# Patient Record
Sex: Female | Born: 1960 | Race: White | Hispanic: No | Smoking: Former smoker
Health system: Southern US, Community
[De-identification: ages and names within clinical notes are randomized; demographics above are authoritative.]

## PROBLEM LIST (undated history)

## (undated) DIAGNOSIS — F1020 Alcohol dependence, uncomplicated: Secondary | ICD-10-CM

## (undated) DIAGNOSIS — E079 Disorder of thyroid, unspecified: Secondary | ICD-10-CM

## (undated) HISTORY — PX: ABDOMINAL HYSTERECTOMY: SHX81

## (undated) HISTORY — PX: TUBAL LIGATION: SHX77

---

## 2018-10-21 ENCOUNTER — Encounter (HOSPITAL_COMMUNITY): Payer: Self-pay

## 2018-10-21 ENCOUNTER — Emergency Department (HOSPITAL_COMMUNITY): Payer: BC Managed Care – PPO

## 2018-10-21 ENCOUNTER — Other Ambulatory Visit: Payer: Self-pay

## 2018-10-21 ENCOUNTER — Emergency Department (HOSPITAL_COMMUNITY)
Admission: EM | Admit: 2018-10-21 | Discharge: 2018-10-21 | Disposition: A | Discharge status: Home / Self Care | Payer: BC Managed Care – PPO | Attending: Emergency Medicine | Admitting: Emergency Medicine

## 2018-10-21 DIAGNOSIS — E039 Hypothyroidism, unspecified: Secondary | ICD-10-CM | POA: Insufficient documentation

## 2018-10-21 DIAGNOSIS — Z87891 Personal history of nicotine dependence: Secondary | ICD-10-CM | POA: Insufficient documentation

## 2018-10-21 DIAGNOSIS — M7918 Myalgia, other site: Secondary | ICD-10-CM

## 2018-10-21 DIAGNOSIS — R0789 Other chest pain: Secondary | ICD-10-CM | POA: Diagnosis present

## 2018-10-21 DIAGNOSIS — R0782 Intercostal pain: Secondary | ICD-10-CM | POA: Diagnosis not present

## 2018-10-21 HISTORY — DX: Disorder of thyroid, unspecified: E07.9

## 2018-10-21 HISTORY — DX: Alcohol dependence, uncomplicated: F10.20

## 2018-10-21 LAB — CBC
HCT: 29.7 % — ABNORMAL LOW (ref 36.0–46.0)
Hemoglobin: 9.5 g/dL — ABNORMAL LOW (ref 12.0–15.0)
MCH: 33.9 pg (ref 26.0–34.0)
MCHC: 32 g/dL (ref 30.0–36.0)
MCV: 106.1 fL — ABNORMAL HIGH (ref 80.0–100.0)
Platelets: 363 10*3/uL (ref 150–400)
RBC: 2.8 MIL/uL — ABNORMAL LOW (ref 3.87–5.11)
RDW: 16.6 % — ABNORMAL HIGH (ref 11.5–15.5)
WBC: 8.9 10*3/uL (ref 4.0–10.5)
nRBC: 0 % (ref 0.0–0.2)

## 2018-10-21 LAB — BASIC METABOLIC PANEL
Anion gap: 8 (ref 5–15)
BUN: 10 mg/dL (ref 6–20)
CO2: 23 mmol/L (ref 22–32)
Calcium: 9.2 mg/dL (ref 8.9–10.3)
Chloride: 107 mmol/L (ref 98–111)
Creatinine, Ser: 0.69 mg/dL (ref 0.44–1.00)
GFR calc Af Amer: >60 mL/min (ref >60)
GFR calc non Af Amer: >60 mL/min (ref >60)
Glucose, Bld: 100 mg/dL — ABNORMAL HIGH (ref 70–99)
Potassium: 3.9 mmol/L (ref 3.5–5.1)
Sodium: 138 mmol/L (ref 135–145)

## 2018-10-21 LAB — D-DIMER, QUANTITATIVE: D-Dimer, Quant: 0.78 ug/mL-FEU — ABNORMAL HIGH (ref 0.00–0.50)

## 2018-10-21 LAB — TROPONIN I (HIGH SENSITIVITY)
Troponin I (High Sensitivity): 4 ng/L (ref <18)
Troponin I (High Sensitivity): 3 ng/L (ref <18)

## 2018-10-21 MED ORDER — SODIUM CHLORIDE 0.9% FLUSH
3.0000 mL | Freq: Once | INTRAVENOUS | Status: AC
  Administered 2018-10-21: 16:00:00 3 mL via INTRAVENOUS

## 2018-10-21 MED ORDER — LIDOCAINE 5 % EX PTCH
1.0000 | MEDICATED_PATCH | Freq: Two times a day (BID) | CUTANEOUS | Status: DC | PRN
  Administered 2018-10-21: 1 via TRANSDERMAL
  Filled 2018-10-21: qty 1

## 2018-10-21 MED ORDER — LIDOCAINE 5 % EX PTCH: 1.0000 | MEDICATED_PATCH | Freq: Every day | CUTANEOUS | 0 refills | Status: AC | PRN

## 2018-10-21 MED ORDER — IOHEXOL 350 MG/ML SOLN
75.0000 mL | Freq: Once | INTRAVENOUS | Status: AC | PRN
  Administered 2018-10-21: 18:00:00 75 mL via INTRAVENOUS

## 2018-10-21 NOTE — ED Provider Notes (Signed)
[facility] EMERGENCY DEPARTMENT Provider Note   CSN: [id number] Arrival date & time: 10/21/18  1552    History   Chief Complaint Chief Complaint  Patient presents with   Chest Pain    HPI Imagene Boss is a 58 y.o. female.     HPI  Pt is a 58 year old female with a PMHx of alcoholism, anxiety, hypothyroidism who is currently at the beginning of week 2 of a 28-day inpatient alcohol treatment facility who presents to the ED today with a two day history of left chest wall pain. The pain is located along the left anterior lower chest wall at region of ribs 6-7 as well as just above her left clavicle. The pain is described as sharp and worse with deep breathing. It is improved with ibuprofen. She is concerned about a "blood clot." Reports acute onset of sharp pain to left chest wall, two nights ago, located under the left breast.    Past Medical History:  Diagnosis Date   Alcoholism ([place])    Thyroid disease     There are no active problems to display for this patient.   Past Surgical History:  Procedure Laterality Date   ABDOMINAL HYSTERECTOMY     TUBAL LIGATION       OB History   No obstetric history on file.      Home Medications    Prior to Admission medications   Not on File    Family History No family history on file.  Social History Social History   Tobacco Use   Smoking status: Former Smoker   Smokeless tobacco: Never Used  Substance Use Topics   Alcohol use: Yes    Comment: last use 10/07/18   Drug use: Never     Allergies   Patient has no known allergies.   Review of Systems Review of Systems  Constitutional: Negative for chills, fatigue and fever.  HENT: Negative for sore throat, trouble swallowing and voice change.   Respiratory: Negative for cough, chest tightness, shortness of breath and wheezing.   Cardiovascular: Positive for chest pain (left anterior, see above). Negative for palpitations and leg swelling.    Gastrointestinal: Negative for abdominal pain, nausea and vomiting.  Genitourinary: Negative for difficulty urinating, dysuria, pelvic pain and urgency.  Musculoskeletal: Negative for neck pain and neck stiffness.  Skin: Negative for color change and wound.  Neurological: Negative for weakness, light-headedness and numbness.  Psychiatric/Behavioral: Positive for sleep disturbance. The patient is nervous/anxious.      Physical Exam Updated Vital Signs BP 117/74    Pulse 82    Temp 99 F (37.2 C) (Oral)    Resp 14    Ht 5' 5.5" (1.664 m)    Wt 60.3 kg    SpO2 96%    BMI 21.80 kg/m   Physical Exam Vitals signs and nursing note reviewed.  Constitutional:      General: She is not in acute distress.    Appearance: She is well-developed. She is not ill-appearing.  HENT:     Head: Normocephalic and atraumatic.  Eyes:     Conjunctiva/sclera: Conjunctivae normal.  Neck:     Musculoskeletal: Neck supple.  Cardiovascular:     Rate and Rhythm: Normal rate and regular rhythm.     Heart sounds: No murmur.  Pulmonary:     Effort: Pulmonary effort is normal. No respiratory distress.     Breath sounds: Normal breath sounds.  Abdominal:     Palpations: Abdomen  is soft.     Tenderness: There is no abdominal tenderness.  Skin:    General: Skin is warm and dry.  Neurological:     Mental Status: She is alert.     GCS: GCS eye subscore is 4. GCS verbal subscore is 5. GCS motor subscore is 6.      ED Treatments / Results  Labs (all labs ordered are listed, but only abnormal results are displayed) Labs Reviewed  BASIC METABOLIC PANEL - Abnormal; Notable for the following components:      Result Value   Glucose, Bld 100 (*)    All other components within normal limits  CBC - Abnormal; Notable for the following components:   RBC 2.80 (*)    Hemoglobin 9.5 (*)    HCT 29.7 (*)    MCV 106.1 (*)    RDW 16.6 (*)    All other components within normal limits  D-DIMER, QUANTITATIVE (NOT AT  [facility]) - Abnormal; Notable for the following components:   D-Dimer, Quant 0.78 (*)    All other components within normal limits  TROPONIN I (HIGH SENSITIVITY)  TROPONIN I (HIGH SENSITIVITY)    EKG None  Radiology  Dg Chest 2 View  Result Date: 10/21/2018 CLINICAL DATA:  58 year old presenting with a 3 day history of LEFT-sided chest pain. Former smoker. EXAM: CHEST - 2 VIEW COMPARISON:  None. FINDINGS: Cardiomediastinal silhouette unremarkable. Lungs clear. Bronchovascular markings normal. Pulmonary vascularity normal. No visible pleural effusions. No pneumothorax. Mild elevation of the LEFT hemidiaphragm due to mild gaseous distention of the stomach and the splenic flexure of the colon. Visualized bony thorax intact. IMPRESSION: No acute cardiopulmonary disease. Electronically Signed   By: Hulan Saashomas  Lawrence M.D.   On: 10/21/2018 16:59   Ct Angio Chest Pe W And/or Wo Contrast  Result Date: 10/21/2018 CLINICAL DATA:  Sharp anterior left chest pain. Elevated D-dimer. EXAM: CT ANGIOGRAPHY CHEST WITH CONTRAST TECHNIQUE: Multidetector CT imaging of the chest was performed using the standard protocol during bolus administration of intravenous contrast. Multiplanar CT image reconstructions and MIPs were obtained to evaluate the vascular anatomy. CONTRAST:  75mL OMNIPAQUE IOHEXOL 350 MG/ML SOLN COMPARISON:  Chest x-ray dated 10/21/2018 FINDINGS: Cardiovascular: Satisfactory opacification of the pulmonary arteries to the segmental level. No evidence of pulmonary embolism. Normal heart size. No pericardial effusion. Mediastinum/Nodes: No enlarged mediastinal, hilar, or axillary lymph nodes. Thyroid gland, trachea, and esophagus demonstrate no significant findings. Lungs/Pleura: Lungs are clear. No pleural effusion or pneumothorax. Upper Abdomen: Normal. Musculoskeletal: No chest wall abnormality. No acute or significant osseous findings. Review of the MIP images confirms the above findings. IMPRESSION: Normal  CT angiogram of the chest. Electronically Signed   By: Francene BoyersJames  Maxwell M.D.   On: 10/21/2018 18:34    Procedures Procedures (including critical care time)  Medications Ordered in ED Medications  sodium chloride flush (NS) 0.9 % injection 3 mL (3 mLs Intravenous Given 10/21/18 1622)  iohexol (OMNIPAQUE) 350 MG/ML injection 75 mL (75 mLs Intravenous Contrast Given 10/21/18 1813)     Initial Impression / Assessment and Plan / ED Course  I have reviewed the triage vital signs and the nursing notes.  Pertinent labs & imaging results that were available during my care of the patient were reviewed by me and considered in my medical decision making (see chart for details).  Of note, this patient was evaluated in the Emergency Department for the symptoms described in the history of present illness. She was evaluated in the context of the  global COVID-19 pandemic, which necessitated consideration that the patient might be at risk for infection with the SARS-CoV-2 virus that causes COVID-19. Institutional protocols and algorithms that pertain to the evaluation of patients at risk for COVID-19 are in a state of rapid change based on information released by regulatory bodies including the CDC and federal and state organizations. These policies and algorithms were followed during the patient's care in the ED.  During this patient encounter, the patient was wearing a mask, and throughout this encounter I was wearing at least a surgical mask.  I was not within 6 feet of this patient for more than 15 minutes without eye protection when they were not wearing mask.   Differentials considered: intercostal muscle strain, chest wall pain, ACS, PE, pneumonia  EM Physician interpretation of Labs & Imaging:  Normal CXR  Normal BMP and Troponin   CBC with chronic, stable anemia  D-dimer elevated > CTA PE scan negative   Medical Decision Making:  Venia MinksKaren Shippy is a 58 y.o. female with a PMHx significant for  alcoholism and is currently residing at the [facility] inpatient [facility] facility who presents to the ED today for evaluation of a sharp pain along her lower left chest and to a focal point above her clavicle on the same side. See HPI above for further details regarding the pain itself. After a thorough HPI, PE and chart review,  I doubt pneumonia, ACS, pleural effusion or pulmonary embolism as the primary etiology for her current pain.  It is most likely musculoskeletal chest wall pain. She has some comfort/minor relief when cupping/holding a hand directly over the same affected area, and has some relief with NSAIDs. I encouraged her to continue with OTC NSAIDS as tolerated and as needed. She should follow up with her PCP once she gets back home and or at the earliest next available ED follow up visit.   She was given a Rx for Lidoderm patches.   The plan for this patient was discussed with my attending physician, Dr. Venia MinksKaren Ponder, who voiced agreement and who oversaw evaluation and treatment of this patient.   CLINICAL IMPRESSION: 1. Chest wall pain   2. Intercostal muscle pain      Disposition: Discharge  Key discharge instructions: Strict return precautions provided. Patient was encouraged to return to the ED should they experience worsening or persistence of current symptoms, or should they develop new concerning symptoms. Encouraged them to f/u with their PCP on an outpatient basis. Questions regarding the diagnosis were answered, and side effects regarding therapies were provided in writing or orally. Patient discharged in stable condition.   Moises Terpstra A. Mayford KnifeWilliams, MD Resident Physician, PGY-3 Emergency Medicine [employer]    Saverio DankerWilliams, Avyaan Summer A, MD 10/22/18 [id number]    Gwyneth SproutPlunkett, [place], MD 10/22/18 [phone]

## 2018-10-21 NOTE — ED Notes (Signed)
Calling [facility] to give Report and arrange transport when the pt is discharged.

## 2018-10-21 NOTE — ED Notes (Signed)
Patient verbalizes understanding of discharge instructions. Opportunity for questioning and answers were provided. Armband removed by staff, pt discharged from ED.  

## 2018-10-21 NOTE — ED Notes (Signed)
Pt ambulated to the RR O2 was 100% and HR was 93 when returning back to room. Small amount of chest pain. Pt stated chest pain feels about the same as before. No complaints of  SOB or dizziness.

## 2018-10-21 NOTE — ED Triage Notes (Signed)
Pt arrives EMS from [facility] where she has had left sided sharp chest pain since Saturday. Worsening since Saturday night. Family hx of blood clot.

## 2018-10-22 ENCOUNTER — Encounter (HOSPITAL_COMMUNITY): Payer: Self-pay | Admitting: Emergency Medicine

## 2020-01-02 DEATH — deceased

## 2020-07-21 IMAGING — CT CT ANGIOGRAPHY CHEST
2 of 7 series · 18 of 46 positions shown · IV contrast (APPLIED)
Comparison: Chest x-ray dated 10/21/2018

CLINICAL DATA: Sharp anterior left chest pain. Elevated D-dimer.

EXAM:
CT ANGIOGRAPHY CHEST WITH CONTRAST
TECHNIQUE: Multidetector CT imaging of the chest was performed using the
standard protocol during bolus administration of intravenous
contrast. Multiplanar CT image reconstructions and MIPs were
obtained to evaluate the vascular anatomy.
CONTRAST:  75mL OMNIPAQUE IOHEXOL 350 MG/ML SOLN

[Series 7: thins · axial · 0.62mm/px · z∈[+918,+1137]mm · 15 of 353 slices shown]
[im 20/353  lung]
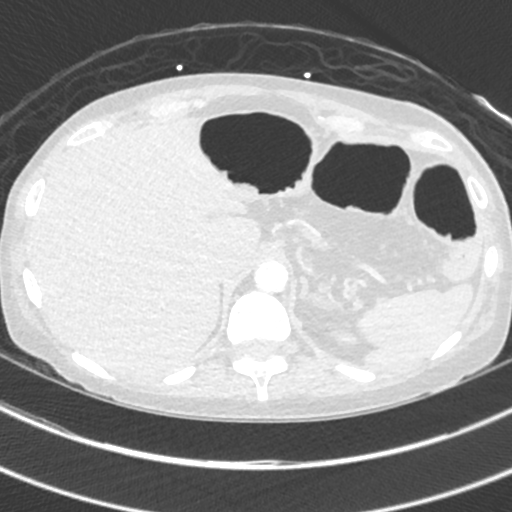
[im 40/353  soft-tissue]
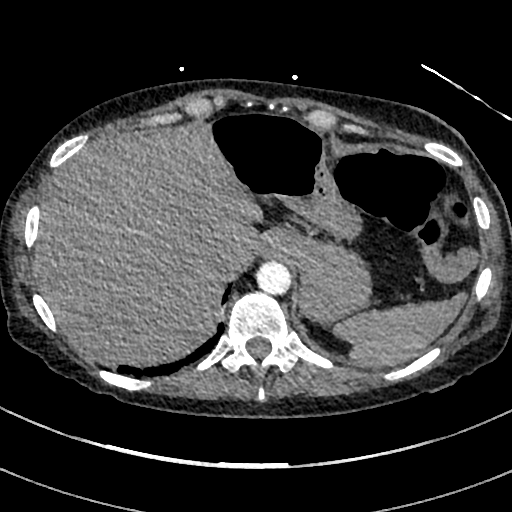
[im 59/353  lung]
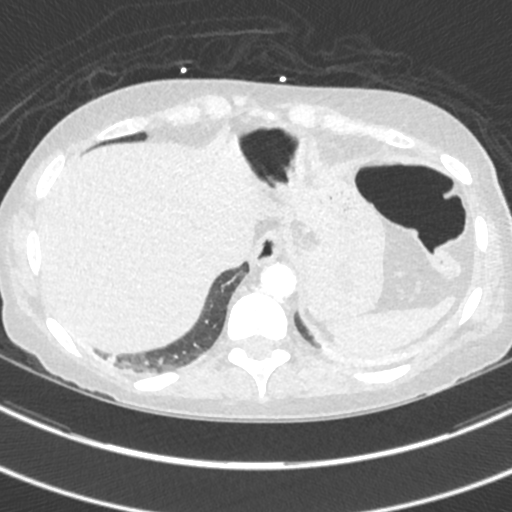
[im 79/353  soft-tissue]
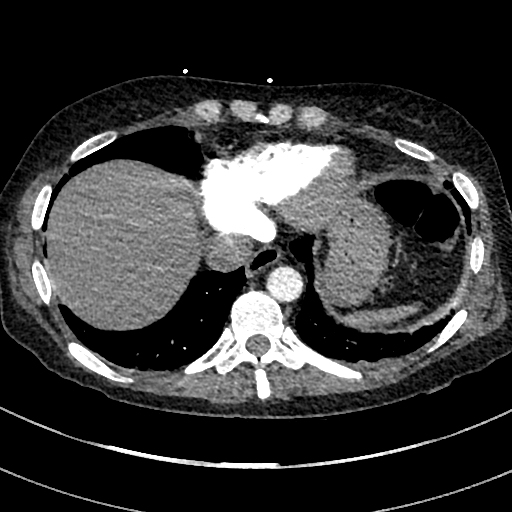
[im 118/353  lung]
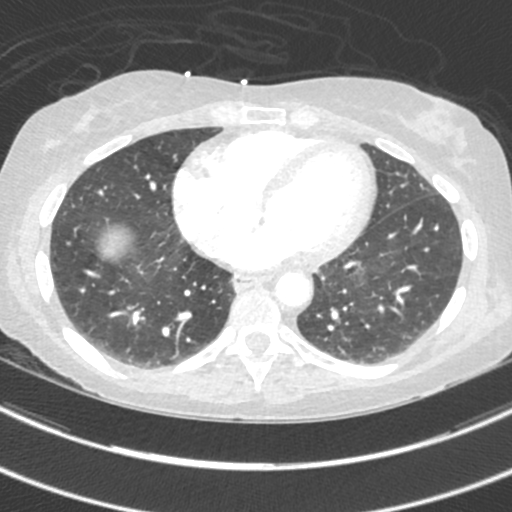
[im 137/353  soft-tissue]
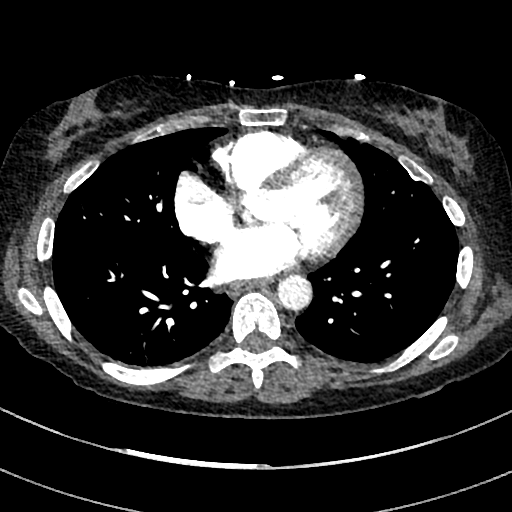
[im 157/353  lung]
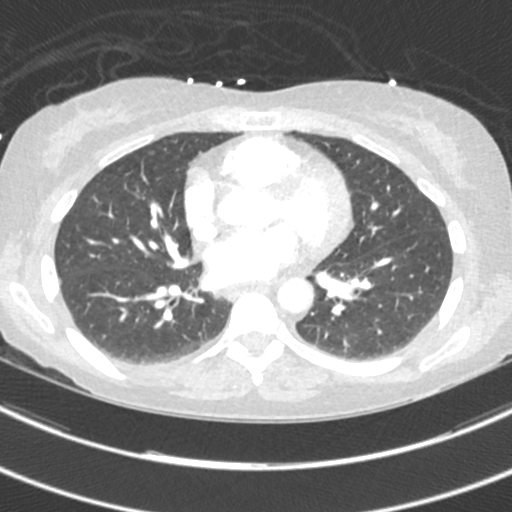
[im 177/353  soft-tissue]
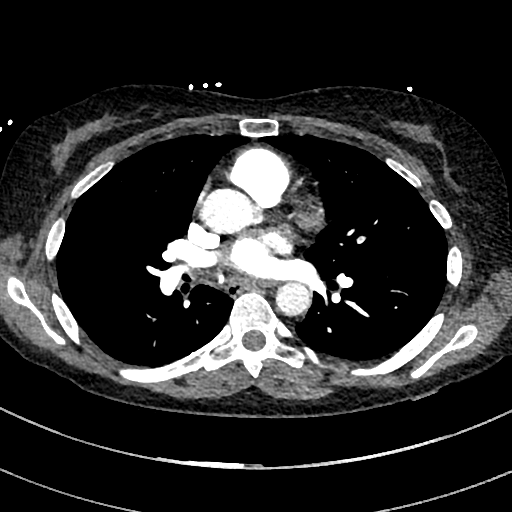
[im 196/353  lung]
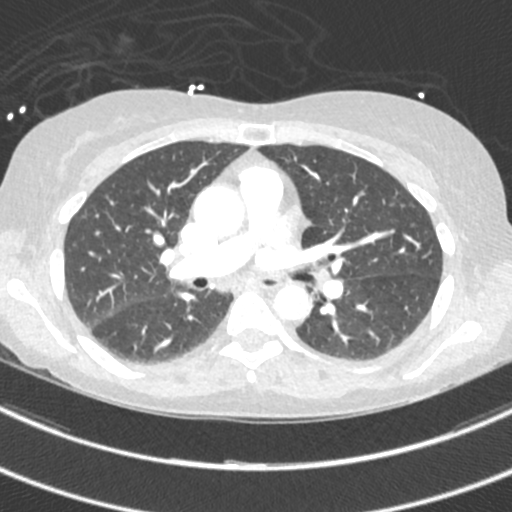
[im 216/353  soft-tissue]
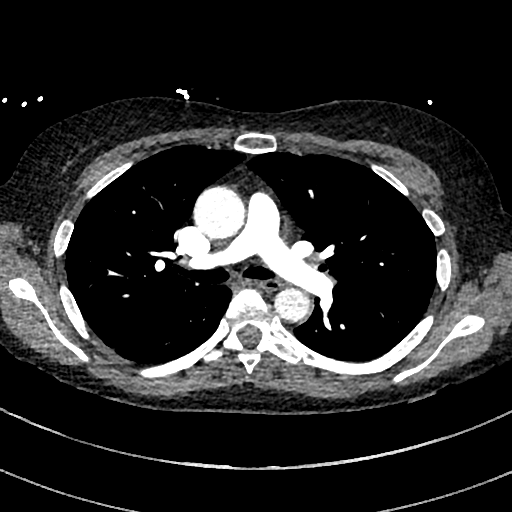
[im 235/353  lung]
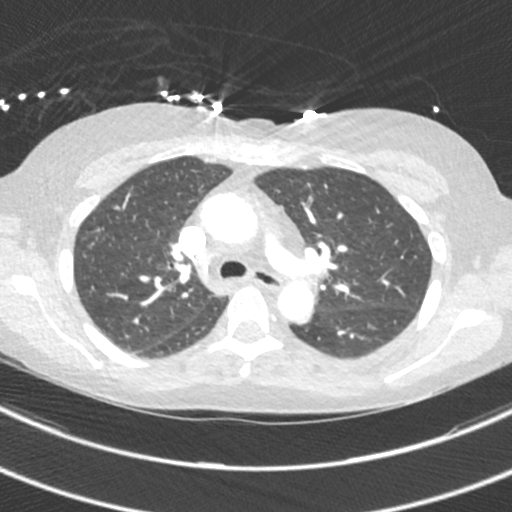
[im 274/353  soft-tissue]
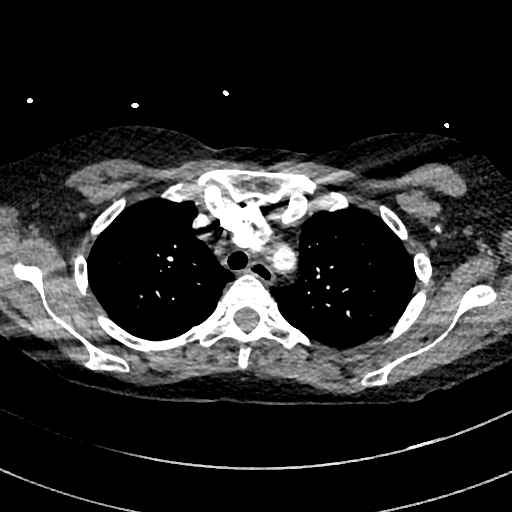
[im 294/353  lung]
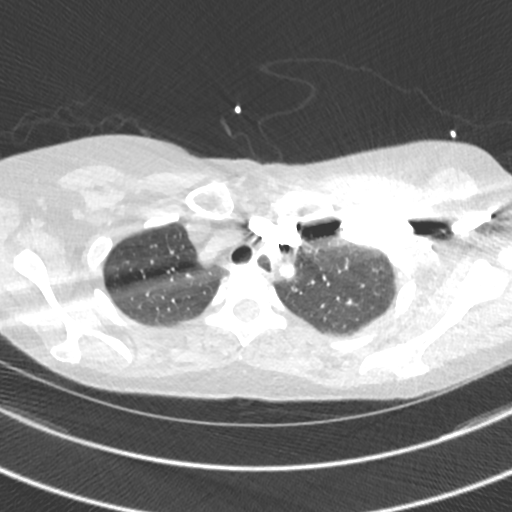
[im 313/353  soft-tissue]
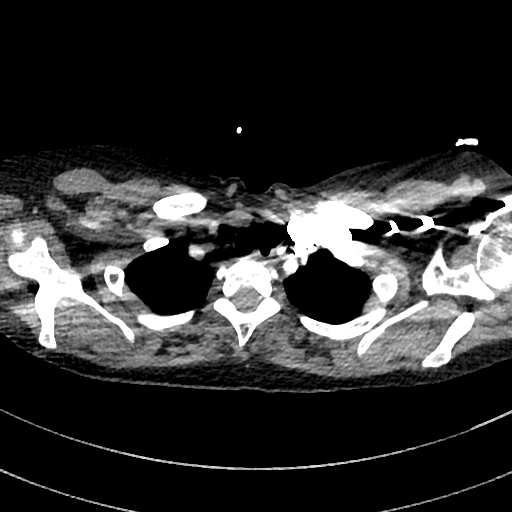
[im 333/353  lung]
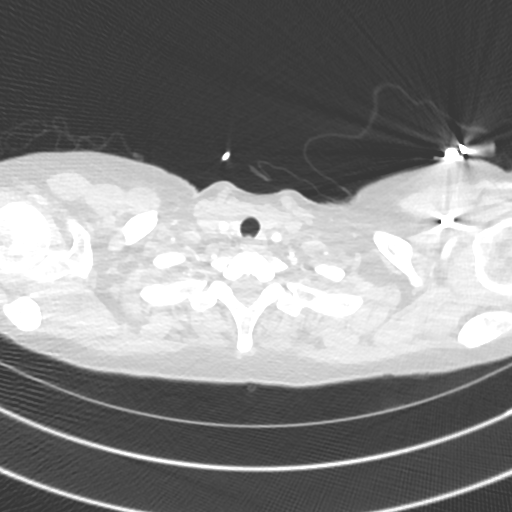

[Series 8: cor · coronal · 0.50mm/px · 3 of 118 slices shown]
[im 30/118  soft-tissue]
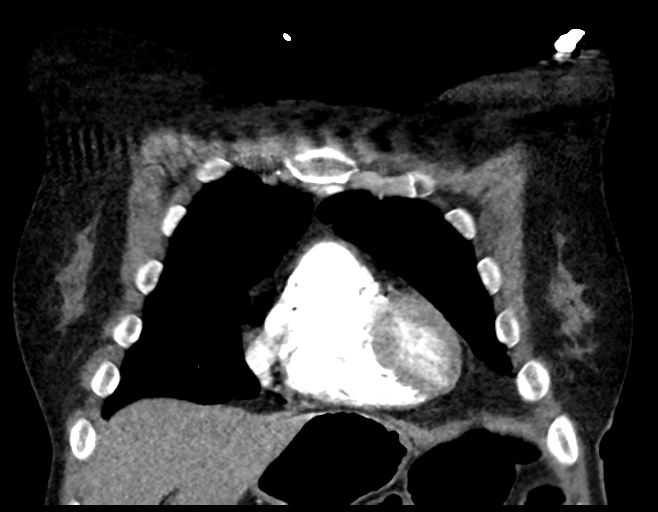
[im 59/118  soft-tissue]
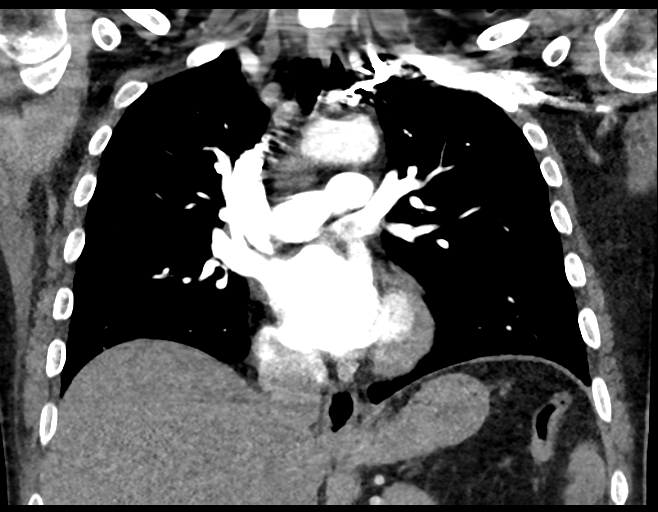
[im 88/118  soft-tissue]
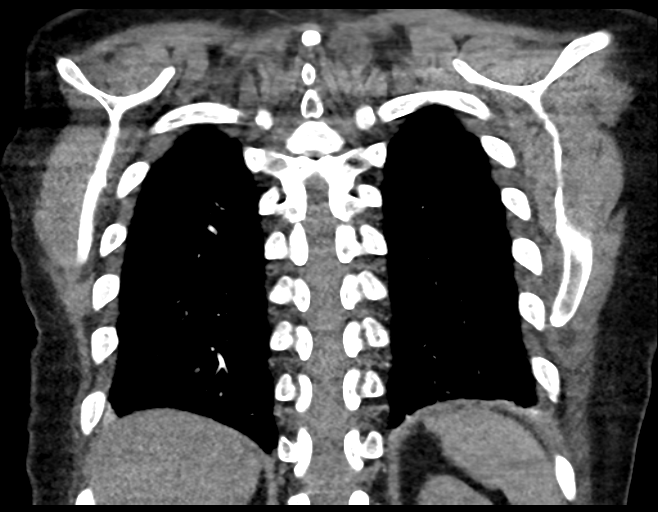

[18 of 46 positions shown; findings below may reference images not displayed]

FINDINGS: Cardiovascular: Satisfactory opacification of the pulmonary arteries
to the segmental level. No evidence of pulmonary embolism. Normal
heart size. No pericardial effusion.

Mediastinum/Nodes: No enlarged mediastinal, hilar, or axillary lymph
nodes. Thyroid gland, trachea, and esophagus demonstrate no
significant findings.

Lungs/Pleura: Lungs are clear. No pleural effusion or pneumothorax.

Upper Abdomen: Normal.

Musculoskeletal: No chest wall abnormality. No acute or significant
osseous findings.

Review of the MIP images confirms the above findings.
IMPRESSION: Normal CT angiogram of the chest.
